# Patient Record
Sex: Male | Born: 1955 | Hispanic: No | Marital: Married | Smoking: Never smoker
Health system: Southern US, Community
[De-identification: ages and names within clinical notes are randomized; demographics above are authoritative.]

---

## 2007-07-23 ENCOUNTER — Ambulatory Visit: Payer: Self-pay | Admitting: Cardiovascular Disease

## 2017-10-22 ENCOUNTER — Other Ambulatory Visit: Payer: Self-pay | Admitting: Specialist

## 2017-10-22 DIAGNOSIS — S86912A Strain of unspecified muscle(s) and tendon(s) at lower leg level, left leg, initial encounter: Secondary | ICD-10-CM

## 2017-10-24 ENCOUNTER — Ambulatory Visit
Admission: RE | Admit: 2017-10-24 | Discharge: 2017-10-24 | Disposition: A | Discharge status: Home / Self Care | Payer: BLUE CROSS/BLUE SHIELD | Source: Ambulatory Visit | Attending: Specialist | Admitting: Specialist

## 2017-10-24 DIAGNOSIS — S86912A Strain of unspecified muscle(s) and tendon(s) at lower leg level, left leg, initial encounter: Secondary | ICD-10-CM

## 2017-10-27 ENCOUNTER — Other Ambulatory Visit: Payer: Self-pay

## 2019-07-24 IMAGING — MR MR KNEE*L* W/O CM
5 of 8 series · 27 of 40 positions shown · non-contrast
Comparison: None.

CLINICAL DATA: Knee pain with instability and popping. Patient
reports knee pops in and out when bending and extending knee.
History of left knee surgery 23 years ago. No recent injury.

EXAM:
MRI OF THE LEFT KNEE WITHOUT CONTRAST
TECHNIQUE: Multiplanar, multisequence MR imaging of the knee was performed. No
intravenous contrast was administered.

[Series 3: T2 fat-sat · axial · 3.5mm · 0.62mm/px · z∈[-79,+68]mm · 7 of 36 slices shown (1 of 3)]
[im 1/36]
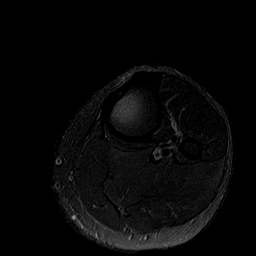
[im 6/36]
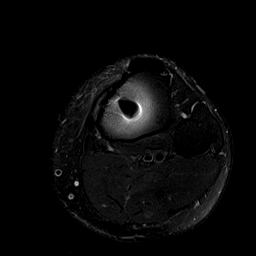
[im 12/36]
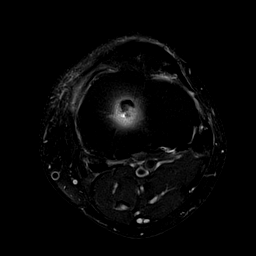
[im 18/36]
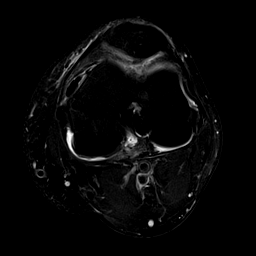
[im 24/36]
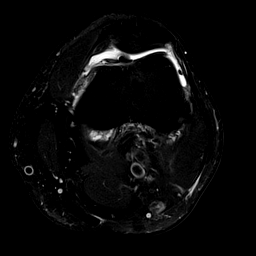
[im 30/36]
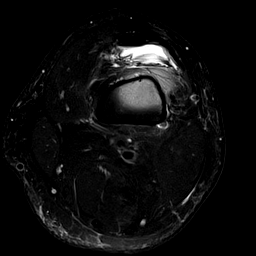
[im 36/36]
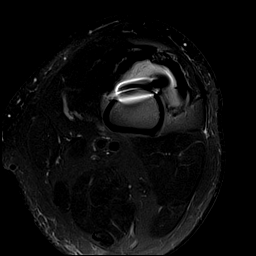

[Series 4: T1 · coronal · 3.5mm · 0.25mm/px · 5 of 30 slices shown]
[im 1/30]
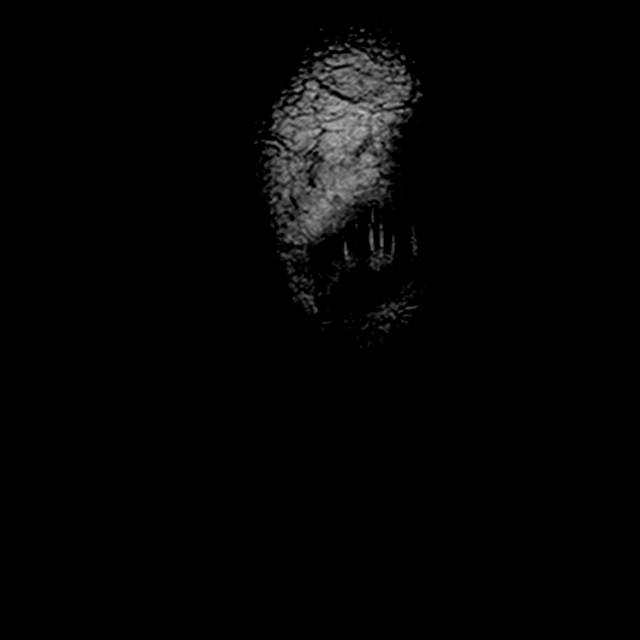
[im 8/30]
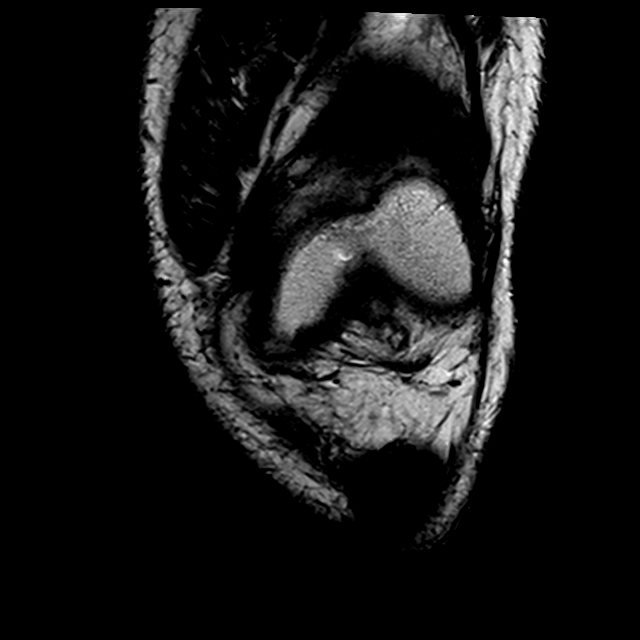
[im 15/30]
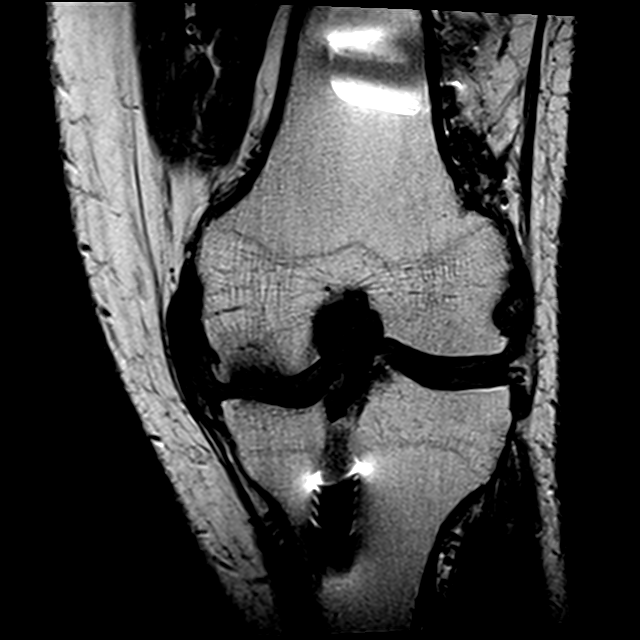
[im 22/30]
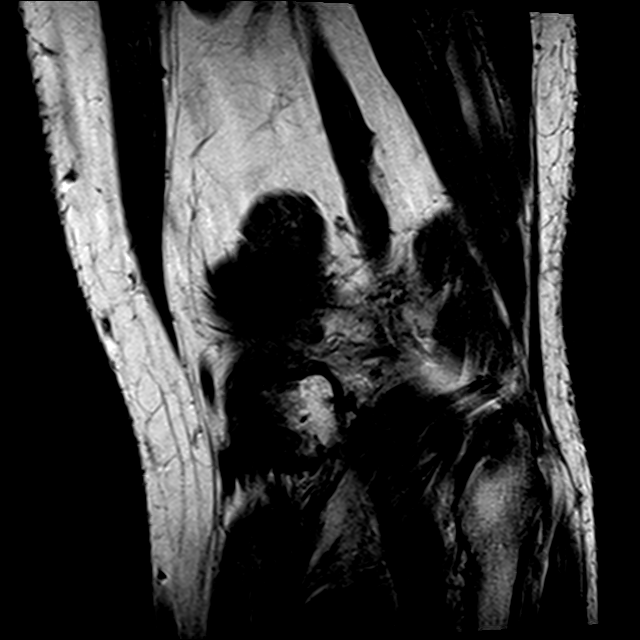
[im 30/30]
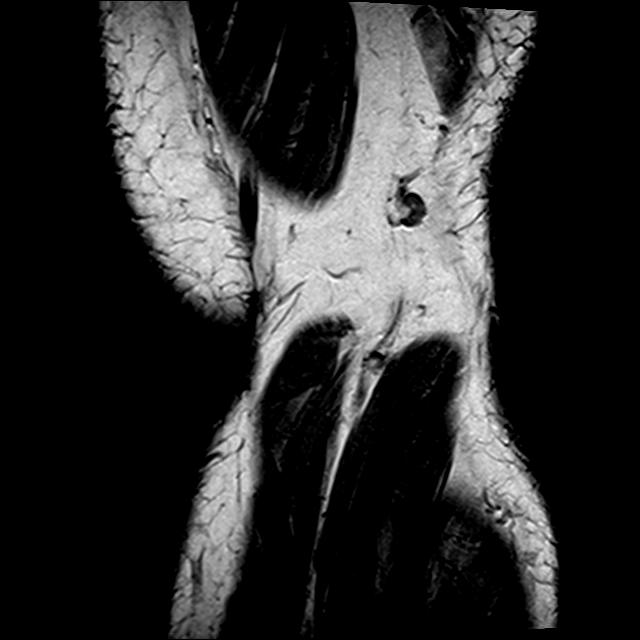

[Series 5: T2 fat-sat · coronal · 3.5mm · 0.62mm/px · 5 of 30 slices shown (2 of 3)]
[im 1/30]
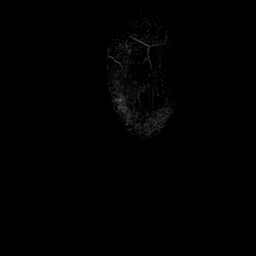
[im 8/30]
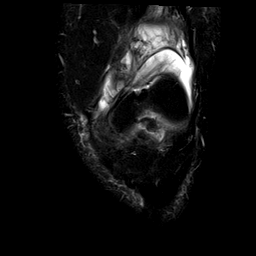
[im 15/30]
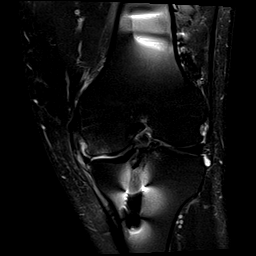
[im 22/30]
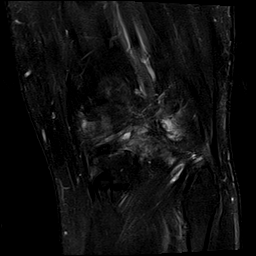
[im 30/30]
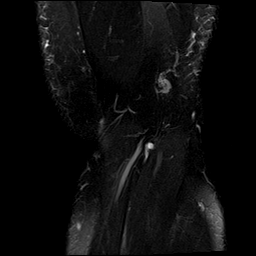

[Series 7: PD fat-sat · sagittal · 3.5mm · 0.31mm/px · 5 of 28 slices shown]
[im 1/28]
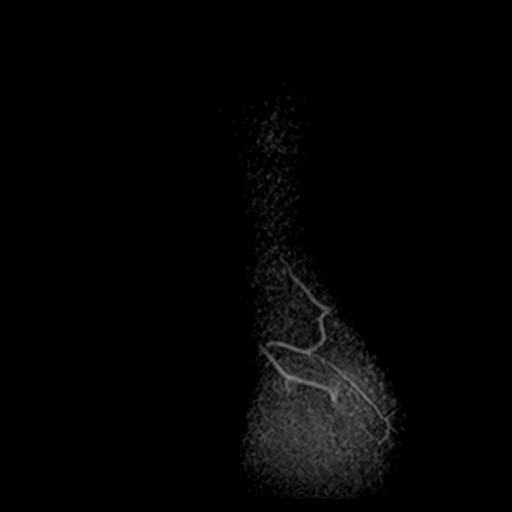
[im 7/28]
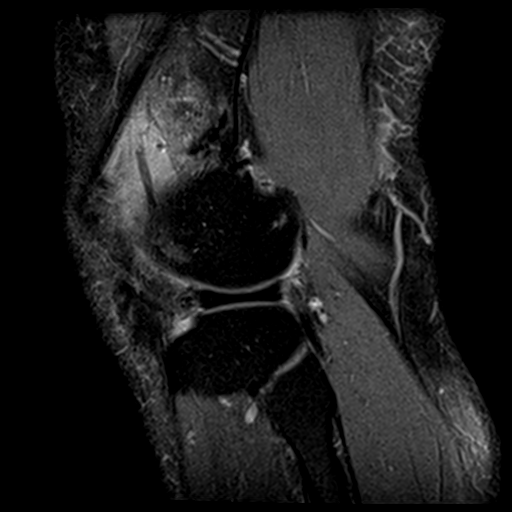
[im 14/28]
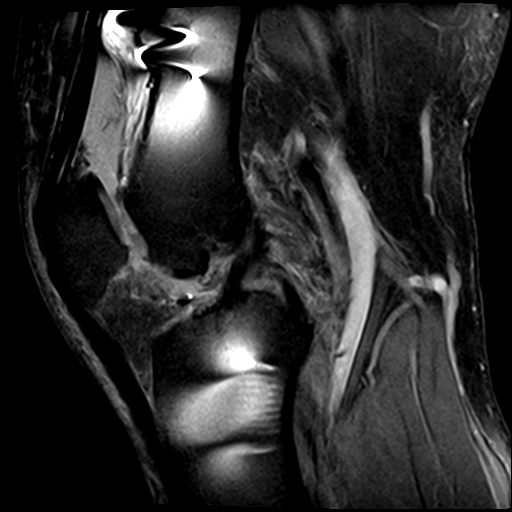
[im 21/28]
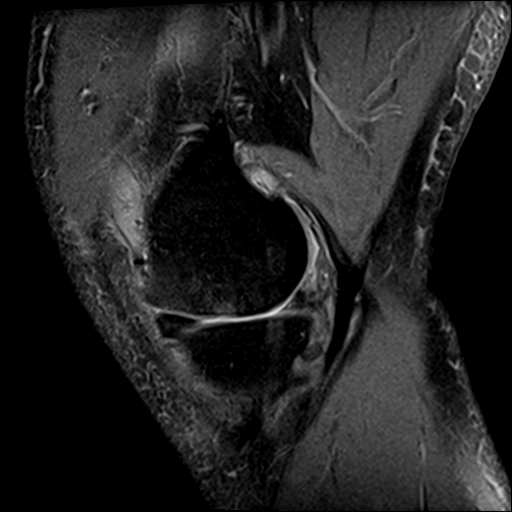
[im 28/28]
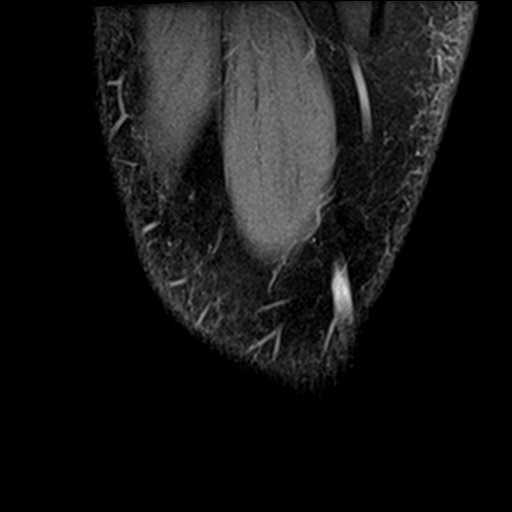

[Series 8: T2 fat-sat · sagittal · 3.5mm · 0.62mm/px · 5 of 28 slices shown (3 of 3)]
[im 1/28]
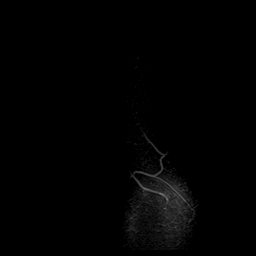
[im 7/28]
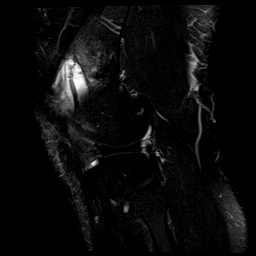
[im 14/28]
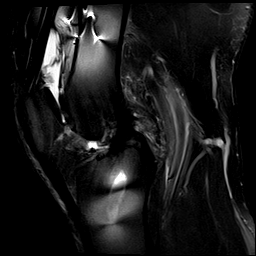
[im 21/28]
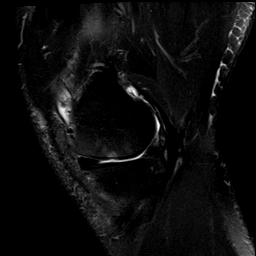
[im 28/28]
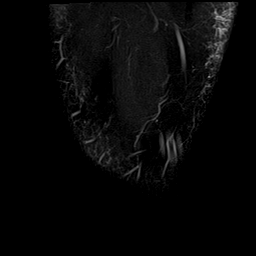

[27 of 40 positions shown; findings below may reference images not displayed]

FINDINGS: MENISCI

Medial meniscus: The medial meniscus is diminutive and partially
extruded peripherally from the joint. There is diffuse free edge
irregularity, especially in the body and posterior horn, including
the meniscal root. No centrally displaced meniscal fragment is
identified, although there is a possible large meniscal fragment in
the anterior meniscofemoral recess. This is most obvious on sagittal
image [DATE]. There is some postsurgical susceptibility artifact along
the superior aspect of this fragment which is larger than typical.

Lateral meniscus:  Discoid configuration without evidence of tear.

LIGAMENTS

Cruciates: Status post ACL reconstruction. The ACL graft is intact
the PCL is intact.

Collaterals: Intact. There is medial buckling and degeneration of
the medial collateral ligament.

CARTILAGE

Patellofemoral: Mild chondral thinning, surface irregularity and
osteophyte formation medially. No full-thickness defect.

Medial: Advanced chondral thinning with osteophytes and subchondral
edema in the medial femoral condyle.

Lateral:  Mild chondral thinning without focal defect.

MISCELLANEOUS

Joint: Moderate size joint effusion with synovial irregularity
consistent with synovitis. There are scattered foci of postsurgical
susceptibility artifact in the joint. As above, there is a possible
large meniscal fragment adjacent to the medial femoral condyle.

Popliteal Fossa:  Unremarkable. No significant Baker's cyst.

Extensor Mechanism: Intact. Probable postsurgical changes in the
patellar tendon.

Bones:  No acute or significant extra-articular osseous findings.

Other: Mild postsurgical scarring in Hoffa's fat.
IMPRESSION: 1. Intact ACL graft status post ACL reconstruction.
2. The medial meniscus is diffusely diminutive with free edge
irregularity, likely in part secondary to previous meniscal surgery.
However, there is suspicion of recurrent meniscal tear with a
possible large meniscal fragment in the meniscofemoral recess. This
is larger than typical, perhaps secondary to associated synovitis.
3. Intact, discoid lateral meniscus.
4. Moderately advanced medial compartment degenerative changes
without acute osseous findings.
5. Joint effusion with synovitis.

## 2020-06-27 ENCOUNTER — Ambulatory Visit (INDEPENDENT_AMBULATORY_CARE_PROVIDER_SITE_OTHER): Payer: 59 | Admitting: Podiatry

## 2020-06-27 ENCOUNTER — Encounter: Payer: Self-pay | Admitting: Podiatry

## 2020-06-27 ENCOUNTER — Other Ambulatory Visit: Payer: Self-pay

## 2020-06-27 ENCOUNTER — Ambulatory Visit (INDEPENDENT_AMBULATORY_CARE_PROVIDER_SITE_OTHER): Payer: 59

## 2020-06-27 DIAGNOSIS — I10 Essential (primary) hypertension: Secondary | ICD-10-CM | POA: Insufficient documentation

## 2020-06-27 DIAGNOSIS — M722 Plantar fascial fibromatosis: Secondary | ICD-10-CM

## 2020-06-27 DIAGNOSIS — M51369 Other intervertebral disc degeneration, lumbar region without mention of lumbar back pain or lower extremity pain: Secondary | ICD-10-CM | POA: Insufficient documentation

## 2020-06-27 DIAGNOSIS — G8929 Other chronic pain: Secondary | ICD-10-CM | POA: Diagnosis not present

## 2020-06-27 DIAGNOSIS — M79673 Pain in unspecified foot: Secondary | ICD-10-CM | POA: Diagnosis not present

## 2020-06-27 DIAGNOSIS — E782 Mixed hyperlipidemia: Secondary | ICD-10-CM | POA: Insufficient documentation

## 2020-06-27 DIAGNOSIS — M25675 Stiffness of left foot, not elsewhere classified: Secondary | ICD-10-CM | POA: Diagnosis not present

## 2020-06-27 DIAGNOSIS — M5136 Other intervertebral disc degeneration, lumbar region: Secondary | ICD-10-CM | POA: Insufficient documentation

## 2020-06-27 MED ORDER — MELOXICAM 15 MG PO TABS: 15.0000 mg | ORAL_TABLET | Freq: Every day | ORAL | 1 refills | Status: DC

## 2020-06-27 NOTE — Patient Instructions (Signed)
For instructions on how to put on your Night Splint, please visit [email or web address]   Plantar Fasciitis (Heel Spur Syndrome) with Rehab The plantar fascia is a fibrous, ligament-like, soft-tissue structure that spans the bottom of the foot. Plantar fasciitis is a condition that causes pain in the foot due to inflammation of the tissue. SYMPTOMS   Pain and tenderness on the underneath side of the foot.  Pain that worsens with standing or walking. CAUSES  Plantar fasciitis is caused by irritation and injury to the plantar fascia on the underneath side of the foot. Common mechanisms of injury include:  Direct trauma to bottom of the foot.  Damage to a small nerve that runs under the foot where the main fascia attaches to the heel bone.  Stress placed on the plantar fascia due to bone spurs. RISK INCREASES WITH:   Activities that place stress on the plantar fascia (running, jumping, pivoting, or cutting).  Poor strength and flexibility.  Improperly fitted shoes.  Tight calf muscles.  Flat feet.  Failure to warm-up properly before activity.  Obesity. PREVENTION  Warm up and stretch properly before activity.  Allow for adequate recovery between workouts.  Maintain physical fitness:  Strength, flexibility, and endurance.  Cardiovascular fitness.  Maintain a health body weight.  Avoid stress on the plantar fascia.  Wear properly fitted shoes, including arch supports for individuals who have flat feet.  PROGNOSIS  If treated properly, then the symptoms of plantar fasciitis usually resolve without surgery. However, occasionally surgery is necessary.  RELATED COMPLICATIONS   Recurrent symptoms that may result in a chronic condition.  Problems of the lower back that are caused by compensating for the injury, such as limping.  Pain or weakness of the foot during push-off following surgery.  Chronic inflammation, scarring, and partial or complete fascia tear,  occurring more often from repeated injections.  TREATMENT  Treatment initially involves the use of ice and medication to help reduce pain and inflammation. The use of strengthening and stretching exercises may help reduce pain with activity, especially stretches of the Achilles tendon. These exercises may be performed at home or with a therapist. Your caregiver may recommend that you use heel cups of arch supports to help reduce stress on the plantar fascia. Occasionally, corticosteroid injections are given to reduce inflammation. If symptoms persist for greater than 6 months despite non-surgical (conservative), then surgery may be recommended.   MEDICATION   If pain medication is necessary, then nonsteroidal anti-inflammatory medications, such as aspirin and ibuprofen, or other minor pain relievers, such as acetaminophen, are often recommended.  Do not take pain medication within 7 days before surgery.  Prescription pain relievers may be given if deemed necessary by your caregiver. Use only as directed and only as much as you need.  Corticosteroid injections may be given by your caregiver. These injections should be reserved for the most serious cases, because they may only be given a certain number of times.  HEAT AND COLD  Cold treatment (icing) relieves pain and reduces inflammation. Cold treatment should be applied for 10 to 15 minutes every 2 to 3 hours for inflammation and pain and immediately after any activity that aggravates your symptoms. Use ice packs or massage the area with a piece of ice (ice massage).  Heat treatment may be used prior to performing the stretching and strengthening activities prescribed by your caregiver, physical therapist, or athletic trainer. Use a heat pack or soak the injury in warm water.  SEEK IMMEDIATE MEDICAL CARE   IF:  Treatment seems to offer no benefit, or the condition worsens.  Any medications produce adverse side effects.  EXERCISES- RANGE OF  MOTION (ROM) AND STRETCHING EXERCISES - Plantar Fasciitis (Heel Spur Syndrome) These exercises may help you when beginning to rehabilitate your injury. Your symptoms may resolve with or without further involvement from your physician, physical therapist or athletic trainer. While completing these exercises, remember:   Restoring tissue flexibility helps normal motion to return to the joints. This allows healthier, less painful movement and activity.  An effective stretch should be held for at least 30 seconds.  A stretch should never be painful. You should only feel a gentle lengthening or release in the stretched tissue.  RANGE OF MOTION - Toe Extension, Flexion  Sit with your right / left leg crossed over your opposite knee.  Grasp your toes and gently pull them back toward the top of your foot. You should feel a stretch on the bottom of your toes and/or foot.  Hold this stretch for 10 seconds.  Now, gently pull your toes toward the bottom of your foot. You should feel a stretch on the top of your toes and or foot.  Hold this stretch for 10 seconds. Repeat  times. Complete this stretch 3 times per day.   RANGE OF MOTION - Ankle Dorsiflexion, Active Assisted  Remove shoes and sit on a chair that is preferably not on a carpeted surface.  Place right / left foot under knee. Extend your opposite leg for support.  Keeping your heel down, slide your right / left foot back toward the chair until you feel a stretch at your ankle or calf. If you do not feel a stretch, slide your bottom forward to the edge of the chair, while still keeping your heel down.  Hold this stretch for 10 seconds. Repeat 3 times. Complete this stretch 2 times per day.   STRETCH  Gastroc, Standing  Place hands on wall.  Extend right / left leg, keeping the front knee somewhat bent.  Slightly point your toes inward on your back foot.  Keeping your right / left heel on the floor and your knee straight, shift  your weight toward the wall, not allowing your back to arch.  You should feel a gentle stretch in the right / left calf. Hold this position for 10 seconds. Repeat 3 times. Complete this stretch 2 times per day.  STRETCH  Soleus, Standing  Place hands on wall.  Extend right / left leg, keeping the other knee somewhat bent.  Slightly point your toes inward on your back foot.  Keep your right / left heel on the floor, bend your back knee, and slightly shift your weight over the back leg so that you feel a gentle stretch deep in your back calf.  Hold this position for 10 seconds. Repeat 3 times. Complete this stretch 2 times per day.  STRETCH  Gastrocsoleus, Standing  Note: This exercise can place a lot of stress on your foot and ankle. Please complete this exercise only if specifically instructed by your caregiver.   Place the ball of your right / left foot on a step, keeping your other foot firmly on the same step.  Hold on to the wall or a rail for balance.  Slowly lift your other foot, allowing your body weight to press your heel down over the edge of the step.  You should feel a stretch in your right / left calf.  Hold this position   for 10 seconds.  Repeat this exercise with a slight bend in your right / left knee. Repeat 3 times. Complete this stretch 2 times per day.   STRENGTHENING EXERCISES - Plantar Fasciitis (Heel Spur Syndrome)  These exercises may help you when beginning to rehabilitate your injury. They may resolve your symptoms with or without further involvement from your physician, physical therapist or athletic trainer. While completing these exercises, remember:   Muscles can gain both the endurance and the strength needed for everyday activities through controlled exercises.  Complete these exercises as instructed by your physician, physical therapist or athletic trainer. Progress the resistance and repetitions only as guided.  STRENGTH - Towel Curls  Sit in  a chair positioned on a non-carpeted surface.  Place your foot on a towel, keeping your heel on the floor.  Pull the towel toward your heel by only curling your toes. Keep your heel on the floor. Repeat 3 times. Complete this exercise 2 times per day.  STRENGTH - Ankle Inversion  Secure one end of a rubber exercise band/tubing to a fixed object (table, pole). Loop the other end around your foot just before your toes.  Place your fists between your knees. This will focus your strengthening at your ankle.  Slowly, pull your big toe up and in, making sure the band/tubing is positioned to resist the entire motion.  Hold this position for 10 seconds.  Have your muscles resist the band/tubing as it slowly pulls your foot back to the starting position. Repeat 3 times. Complete this exercises 2 times per day.  Document Released: 04/29/2005 Document Revised: 07/22/2011 Document Reviewed: 08/11/2008 [employer] Patient Information 2014 [employer].  

## 2020-07-01 NOTE — Progress Notes (Signed)
Subjective:   Patient ID: Darrell Glenn, male   DOB: 65 y.o.   MRN: [id number]   HPI 65 year old male presents to the office today for concerns of bilateral heel pain.  He states that 2017 time from work he felt on several steps.  Since then he states he has been having discomfort to the heels.  He states the pain gets worse after sitting for some time is better with activity.  He states that the use of the latter makes the foot worse.  He also states that since the injury he had difficulty moving his left big toe.  Right heel is worse than left.  He said no recent treatment for the feet but he states that after the injury he had x-rays and he was told nothing was broken.  No other concerns today.   Review of Systems  All other systems reviewed and are negative.  History reviewed. No pertinent past medical history.  History reviewed. No pertinent surgical history.   Current Outpatient Medications:  .  meloxicam (MOBIC) 15 MG tablet, Take 1 tablet (15 mg total) by mouth daily., Disp: 30 tablet, Rfl: 1 .  Acetaminophen (TYLENOL) 325 MG CAPS, 1 capsule as needed, Disp: , Rfl:  .  aspirin 81 MG EC tablet, Take by mouth., Disp: , Rfl:  .  levothyroxine (SYNTHROID) 25 MCG tablet, Take 25 mcg by mouth daily., Disp: , Rfl:  .  olmesartan (BENICAR) 20 MG tablet, Take by mouth., Disp: , Rfl:  .  pantoprazole (PROTONIX) 40 MG tablet, Take 40 mg by mouth daily., Disp: , Rfl:  .  rosuvastatin (CRESTOR) 10 MG tablet, Take 10 mg by mouth daily., Disp: , Rfl:   Allergies  Allergen Reactions  . Codeine Hives        Objective:  Physical Exam  General: AAO x3, NAD  Dermatological: Skin is warm, dry and supple bilateral. There are no open sores, no preulcerative lesions, no rash or signs of infection present.  Vascular: Dorsalis Pedis artery and Posterior Tibial artery pedal pulses are 2/4 bilateral with immedate capillary fill time.There is no pain with calf compression, swelling, warmth, erythema.    Neruologic: Grossly intact via light touch bilateral.  Sensation intact with Semmes Weinstein monofilament.  Negative Tinel's sign.  Musculoskeletal: There is tenderness palpation on the plantar medial tubercle of the calcaneus at the insertion of plantar fascia on the right side worse than left.  There is no pain with lateral compression of calcaneus.  No pain Achilles tendon.  There is no edema, erythema.  There is decrease in the motion of the first MPJ of the left side.  MMT 5 out of 5 otherwise.  Gait: Unassisted, Nonantalgic.       Assessment:   Plantar fasciitis, decreased left first MPJ range of motion    Plan:  -Treatment options discussed including all alternatives, risks, and complications -Etiology of symptoms were discussed -X-rays were obtained and reviewed with the patient.  There is no definitive evidence of acute fracture or stress fracture.  Likely possible old scarring present calcaneal body but does not appear to be new. -Discussed steroid injection however he decided to hold off on this today. Prescribed mobic. Discussed side effects of the medication and directed to stop if any are to occur and call the office.  -Dispensed plantar fascial brace. -Discussed night splint -Discussed shoe modifications, orthotics as well as stretching exercises daily. -Regards the left hallux decrease tissue motion this is likely remote injury.  Could  have had a tendon tear.  Discussed MRI.  Vivi Barrack DPM

## 2020-08-20 ENCOUNTER — Other Ambulatory Visit: Payer: Self-pay | Admitting: Podiatry

## 2020-10-28 ENCOUNTER — Other Ambulatory Visit: Payer: Self-pay | Admitting: Podiatry

## 2022-07-30 ENCOUNTER — Other Ambulatory Visit: Payer: Self-pay | Admitting: Cardiovascular Disease

## 2022-09-21 ENCOUNTER — Other Ambulatory Visit: Payer: Self-pay | Admitting: Cardiovascular Disease

## 2022-12-04 ENCOUNTER — Telehealth: Payer: Self-pay | Admitting: Family Medicine

## 2022-12-05 MED ORDER — OLMESARTAN MEDOXOMIL 40 MG PO TABS
40.0000 mg | ORAL_TABLET | Freq: Every day | ORAL | 0 refills | Status: DC
Start: 2022-12-05 — End: 2024-03-23

## 2022-12-05 MED ORDER — METOPROLOL SUCCINATE ER 50 MG PO TB24: 50.0000 mg | ORAL_TABLET | Freq: Every day | ORAL | 0 refills | Status: DC

## 2022-12-06 NOTE — Telephone Encounter (Signed)
Error

## 2024-03-23 ENCOUNTER — Encounter: Payer: Self-pay | Admitting: Cardiovascular Disease

## 2024-03-23 ENCOUNTER — Ambulatory Visit (INDEPENDENT_AMBULATORY_CARE_PROVIDER_SITE_OTHER): Payer: Self-pay | Admitting: Cardiovascular Disease

## 2024-03-23 VITALS — BP 123/79 | HR 71 | Ht 66.0 in | Wt 210.6 lb

## 2024-03-23 DIAGNOSIS — E782 Mixed hyperlipidemia: Secondary | ICD-10-CM

## 2024-03-23 DIAGNOSIS — I10 Essential (primary) hypertension: Secondary | ICD-10-CM

## 2024-03-23 DIAGNOSIS — R079 Chest pain, unspecified: Secondary | ICD-10-CM | POA: Diagnosis not present

## 2024-03-23 DIAGNOSIS — R0789 Other chest pain: Secondary | ICD-10-CM

## 2024-03-23 MED ORDER — METOPROLOL SUCCINATE ER 50 MG PO TB24
50.0000 mg | ORAL_TABLET | Freq: Every day | ORAL | 0 refills | Status: AC
Start: 2024-03-23 — End: ?

## 2024-03-23 MED ORDER — OLMESARTAN MEDOXOMIL 40 MG PO TABS: 40.0000 mg | ORAL_TABLET | Freq: Every day | ORAL | 0 refills | Status: AC

## 2024-03-23 MED ORDER — PANTOPRAZOLE SODIUM 40 MG PO TBEC: 40.0000 mg | DELAYED_RELEASE_TABLET | Freq: Every day | ORAL | 2 refills | Status: AC

## 2024-03-23 MED ORDER — ASPIRIN 81 MG PO TBEC: 81.0000 mg | DELAYED_RELEASE_TABLET | Freq: Every day | ORAL | 0 refills | Status: AC

## 2024-03-23 NOTE — Progress Notes (Signed)
 Cardiology Office Note   Date:  03/23/2024   ID:  Colt Martelle, DOB 1956-05-02, MRN [id number]  PCP:  Frederik Charleston, MD  Cardiologist:  Denyse Bathe, MD      History of Present Illness: Darrell Glenn is a 68 y.o. male who presents for  Chief Complaint  Patient presents with   Follow-up    Cardiac follow up    Has occasional chest pain. Concerned as sister had MI and stents.  Chest Pain  This is a recurrent problem. The current episode started in the past 7 days. The onset quality is gradual. The problem occurs daily. The problem has been waxing and waning. The quality of the pain is described as dull.      History reviewed. No pertinent past medical history.   History reviewed. No pertinent surgical history.   Current Outpatient Medications  Medication Sig Dispense Refill   rosuvastatin (CRESTOR) 10 MG tablet Take 10 mg by mouth daily.     aspirin EC 81 MG tablet Take 1 tablet (81 mg total) by mouth daily. 90 tablet 0   metoprolol  succinate (TOPROL -XL) 50 MG 24 hr tablet Take 1 tablet (50 mg total) by mouth daily. 90 tablet 0   olmesartan  (BENICAR ) 40 MG tablet Take 1 tablet (40 mg total) by mouth daily. 90 tablet 0   pantoprazole (PROTONIX) 40 MG tablet Take 1 tablet (40 mg total) by mouth daily. 90 tablet 2   No current facility-administered medications for this visit.    Allergies:   Codeine    Social History:   has no history on file for tobacco use, alcohol use, and drug use.   Family History:  family history is not on file.    ROS:     Review of Systems  Constitutional: Negative.   HENT: Negative.    Eyes: Negative.   Respiratory: Negative.    Cardiovascular:  Positive for chest pain.  Gastrointestinal: Negative.   Genitourinary: Negative.   Musculoskeletal: Negative.   Skin: Negative.   Neurological: Negative.   Endo/Heme/Allergies: Negative.   Psychiatric/Behavioral: Negative.    All other systems reviewed and are negative.     All  other systems are reviewed and negative.    PHYSICAL EXAM: VS:  BP 123/79   Pulse 71   Ht 5' 6 (1.676 m)   Wt 210 lb 9.6 oz (95.5 kg)   SpO2 97%   BMI 33.99 kg/m  , BMI Body mass index is 33.99 kg/m. Last weight:  Wt Readings from Last 3 Encounters:  03/23/24 210 lb 9.6 oz (95.5 kg)     Physical Exam Vitals reviewed.  Constitutional:      Appearance: Normal appearance. He is normal weight.  HENT:     Head: Normocephalic.     Nose: Nose normal.     Mouth/Throat:     Mouth: Mucous membranes are moist.  Eyes:     Pupils: Pupils are equal, round, and reactive to light.  Cardiovascular:     Rate and Rhythm: Normal rate and regular rhythm.     Pulses: Normal pulses.     Heart sounds: Normal heart sounds.  Pulmonary:     Effort: Pulmonary effort is normal.  Abdominal:     General: Abdomen is flat. Bowel sounds are normal.  Musculoskeletal:        General: Normal range of motion.     Cervical back: Normal range of motion.  Skin:    General: Skin is warm.  Neurological:  General: No focal deficit present.     Mental Status: He is alert.  Psychiatric:        Mood and Affect: Mood normal.       EKG: NSR 71/min no acute changes  Recent Labs: No results found for requested labs within last 365 days.    Lipid Panel No results found for: CHOL, TRIG, HDL, CHOLHDL, VLDL, LDLCALC, LDLDIRECT    Other studies Reviewed: Additional studies/ records that were reviewed today include:  Review of the above records demonstrates:       No data to display            ASSESSMENT AND PLAN:    ICD-10-CM   1. Essential hypertension  I10 metoprolol  succinate (TOPROL -XL) 50 MG 24 hr tablet    aspirin EC 81 MG tablet    olmesartan  (BENICAR ) 40 MG tablet    pantoprazole (PROTONIX) 40 MG tablet    PCV ECHOCARDIOGRAM COMPLETE    MYOCARDIAL PERFUSION IMAGING    Comprehensive metabolic panel    CBC with Differential/Platelet    Lipid Profile    2. Mixed  hyperlipidemia  E78.2 metoprolol  succinate (TOPROL -XL) 50 MG 24 hr tablet    aspirin EC 81 MG tablet    olmesartan  (BENICAR ) 40 MG tablet    pantoprazole (PROTONIX) 40 MG tablet    PCV ECHOCARDIOGRAM COMPLETE    MYOCARDIAL PERFUSION IMAGING    Comprehensive metabolic panel    CBC with Differential/Platelet    Lipid Profile    3. Other chest pain  R07.89 metoprolol  succinate (TOPROL -XL) 50 MG 24 hr tablet    aspirin EC 81 MG tablet    olmesartan  (BENICAR ) 40 MG tablet    pantoprazole (PROTONIX) 40 MG tablet    PCV ECHOCARDIOGRAM COMPLETE    MYOCARDIAL PERFUSION IMAGING    Comprehensive metabolic panel    CBC with Differential/Platelet    Lipid Profile       Problem List Items Addressed This Visit       Cardiovascular and Mediastinum   Essential hypertension - Primary   Relevant Medications   metoprolol  succinate (TOPROL -XL) 50 MG 24 hr tablet   aspirin EC 81 MG tablet   olmesartan  (BENICAR ) 40 MG tablet   pantoprazole (PROTONIX) 40 MG tablet   Other Relevant Orders   PCV ECHOCARDIOGRAM COMPLETE   MYOCARDIAL PERFUSION IMAGING   Comprehensive metabolic panel   CBC with Differential/Platelet   Lipid Profile     Other   Mixed hyperlipidemia   Relevant Medications   metoprolol  succinate (TOPROL -XL) 50 MG 24 hr tablet   aspirin EC 81 MG tablet   olmesartan  (BENICAR ) 40 MG tablet   pantoprazole (PROTONIX) 40 MG tablet   Other Relevant Orders   PCV ECHOCARDIOGRAM COMPLETE   MYOCARDIAL PERFUSION IMAGING   Comprehensive metabolic panel   CBC with Differential/Platelet   Lipid Profile   Other Visit Diagnoses       Other chest pain       Relevant Medications   metoprolol  succinate (TOPROL -XL) 50 MG 24 hr tablet   aspirin EC 81 MG tablet   olmesartan  (BENICAR ) 40 MG tablet   pantoprazole (PROTONIX) 40 MG tablet   Other Relevant Orders   PCV ECHOCARDIOGRAM COMPLETE   MYOCARDIAL PERFUSION IMAGING   Comprehensive metabolic panel   CBC with Differential/Platelet    Lipid Profile          Disposition:   Return in about 6 weeks (around 05/04/2024) for echo, stress test and f/u.  Total time spent: 30 minutes  Signed,  Denyse Bathe, MD  03/23/2024 9:55 AM    [facility]

## 2024-03-24 LAB — CBC WITH DIFFERENTIAL/PLATELET
Basophils Absolute: 0 x10E3/uL (ref 0.0–0.2)
Basos: 1 %
EOS (ABSOLUTE): 0.2 x10E3/uL (ref 0.0–0.4)
Eos: 3 %
Hematocrit: 44.9 % (ref 37.5–51.0)
Hemoglobin: 15.1 g/dL (ref 13.0–17.7)
Immature Grans (Abs): 0 x10E3/uL (ref 0.0–0.1)
Immature Granulocytes: 0 %
Lymphocytes Absolute: 1.9 x10E3/uL (ref 0.7–3.1)
Lymphs: 34 %
MCH: 30.1 pg (ref 26.6–33.0)
MCHC: 33.6 g/dL (ref 31.5–35.7)
MCV: 90 fL (ref 79–97)
Monocytes Absolute: 0.6 x10E3/uL (ref 0.1–0.9)
Monocytes: 10 %
Neutrophils Absolute: 2.9 x10E3/uL (ref 1.4–7.0)
Neutrophils: 52 %
Platelets: 173 x10E3/uL (ref 150–450)
RBC: 5.01 x10E6/uL (ref 4.14–5.80)
RDW: 13.7 % (ref 11.6–15.4)
WBC: 5.6 x10E3/uL (ref 3.4–10.8)

## 2024-03-24 LAB — COMPREHENSIVE METABOLIC PANEL WITH GFR
ALT: 19 IU/L (ref 0–44)
AST: 18 IU/L (ref 0–40)
Albumin: 4.4 g/dL (ref 3.9–4.9)
Alkaline Phosphatase: 93 IU/L (ref 47–123)
BUN/Creatinine Ratio: 24 (ref 10–24)
BUN: 21 mg/dL (ref 8–27)
Bilirubin Total: 0.5 mg/dL (ref 0.0–1.2)
CO2: 21 mmol/L (ref 20–29)
Calcium: 8.8 mg/dL (ref 8.6–10.2)
Chloride: 104 mmol/L (ref 96–106)
Creatinine, Ser: 0.86 mg/dL (ref 0.76–1.27)
Globulin, Total: 2.4 g/dL (ref 1.5–4.5)
Glucose: 107 mg/dL — ABNORMAL HIGH (ref 70–99)
Potassium: 4 mmol/L (ref 3.5–5.2)
Sodium: 140 mmol/L (ref 134–144)
Total Protein: 6.8 g/dL (ref 6.0–8.5)
eGFR: 95 mL/min/1.73 (ref >59)

## 2024-03-24 LAB — LIPID PANEL
Chol/HDL Ratio: 3.4 ratio (ref 0.0–5.0)
Cholesterol, Total: 171 mg/dL (ref 100–199)
HDL: 50 mg/dL (ref >39)
LDL Chol Calc (NIH): 108 mg/dL — ABNORMAL HIGH (ref 0–99)
Triglycerides: 69 mg/dL (ref 0–149)
VLDL Cholesterol Cal: 13 mg/dL (ref 5–40)

## 2024-04-19 ENCOUNTER — Ambulatory Visit

## 2024-04-19 DIAGNOSIS — R0789 Other chest pain: Secondary | ICD-10-CM

## 2024-04-19 DIAGNOSIS — I10 Essential (primary) hypertension: Secondary | ICD-10-CM

## 2024-04-19 DIAGNOSIS — E782 Mixed hyperlipidemia: Secondary | ICD-10-CM

## 2024-04-22 ENCOUNTER — Ambulatory Visit

## 2024-04-22 DIAGNOSIS — R0789 Other chest pain: Secondary | ICD-10-CM | POA: Diagnosis not present

## 2024-04-22 DIAGNOSIS — E782 Mixed hyperlipidemia: Secondary | ICD-10-CM

## 2024-04-22 DIAGNOSIS — I10 Essential (primary) hypertension: Secondary | ICD-10-CM

## 2024-04-27 ENCOUNTER — Ambulatory Visit: Admitting: Cardiovascular Disease

## 2024-04-30 ENCOUNTER — Encounter: Payer: Self-pay | Admitting: Cardiovascular Disease

## 2024-04-30 ENCOUNTER — Ambulatory Visit: Admitting: Cardiovascular Disease

## 2024-04-30 VITALS — BP 119/79 | HR 87 | Ht 66.0 in | Wt 215.0 lb

## 2024-04-30 DIAGNOSIS — K219 Gastro-esophageal reflux disease without esophagitis: Secondary | ICD-10-CM

## 2024-04-30 DIAGNOSIS — I10 Essential (primary) hypertension: Secondary | ICD-10-CM

## 2024-04-30 DIAGNOSIS — R0789 Other chest pain: Secondary | ICD-10-CM

## 2024-04-30 DIAGNOSIS — I340 Nonrheumatic mitral (valve) insufficiency: Secondary | ICD-10-CM | POA: Diagnosis not present

## 2024-04-30 DIAGNOSIS — E782 Mixed hyperlipidemia: Secondary | ICD-10-CM | POA: Diagnosis not present

## 2024-04-30 NOTE — Progress Notes (Signed)
 "     Cardiology Office Note   Date:  04/30/2024   ID:  Darrell Glenn, DOB 1956/02/04, MRN [id number]  PCP:  Frederik Charleston, MD  Cardiologist:  Denyse Bathe, MD      History of Present Illness: Darrell Glenn is a 68 y.o. male who presents for  Chief Complaint  Patient presents with   Follow-up    Echo & NST results    Has occasional chest pains.      History reviewed. No pertinent past medical history.   History reviewed. No pertinent surgical history.   Current Outpatient Medications  Medication Sig Dispense Refill   aspirin  EC 81 MG tablet Take 1 tablet (81 mg total) by mouth daily. 90 tablet 0   metoprolol  succinate (TOPROL -XL) 50 MG 24 hr tablet Take 1 tablet (50 mg total) by mouth daily. 90 tablet 0   olmesartan  (BENICAR ) 40 MG tablet Take 1 tablet (40 mg total) by mouth daily. 90 tablet 0   pantoprazole  (PROTONIX ) 40 MG tablet Take 1 tablet (40 mg total) by mouth daily. (Patient taking differently: Take 40 mg by mouth as needed.) 90 tablet 2   rosuvastatin (CRESTOR) 10 MG tablet Take 10 mg by mouth daily. (Patient taking differently: Take 10 mg by mouth as needed.)     No current facility-administered medications for this visit.    Allergies:   Codeine    Social History:   reports that he has never smoked. He has never used smokeless tobacco. He reports that he does not drink alcohol. No history on file for drug use.   Family History:  family history is not on file.    ROS:     Review of Systems  Constitutional: Negative.   HENT: Negative.    Eyes: Negative.   Respiratory: Negative.    Gastrointestinal: Negative.   Genitourinary: Negative.   Musculoskeletal: Negative.   Skin: Negative.   Neurological: Negative.   Endo/Heme/Allergies: Negative.   Psychiatric/Behavioral: Negative.    All other systems reviewed and are negative.     All other systems are reviewed and negative.    PHYSICAL EXAM: VS:  BP 119/79   Pulse 87   Ht 5' 6 (1.676  m)   Wt 215 lb (97.5 kg)   SpO2 97%   BMI 34.70 kg/m  , BMI Body mass index is 34.7 kg/m. Last weight:  Wt Readings from Last 3 Encounters:  04/30/24 215 lb (97.5 kg)  03/23/24 210 lb 9.6 oz (95.5 kg)     Physical Exam Vitals reviewed.  Constitutional:      Appearance: Normal appearance. He is normal weight.  HENT:     Head: Normocephalic.     Nose: Nose normal.     Mouth/Throat:     Mouth: Mucous membranes are moist.  Eyes:     Pupils: Pupils are equal, round, and reactive to light.  Cardiovascular:     Rate and Rhythm: Normal rate and regular rhythm.     Pulses: Normal pulses.     Heart sounds: Normal heart sounds.  Pulmonary:     Effort: Pulmonary effort is normal.  Abdominal:     General: Abdomen is flat. Bowel sounds are normal.  Musculoskeletal:        General: Normal range of motion.     Cervical back: Normal range of motion.  Skin:    General: Skin is warm.  Neurological:     General: No focal deficit present.     Mental Status:  He is alert.  Psychiatric:        Mood and Affect: Mood normal.       EKG:   Recent Labs: 03/23/2024: ALT 19; BUN 21; Creatinine, Ser 0.86; Hemoglobin 15.1; Platelets 173; Potassium 4.0; Sodium 140    Lipid Panel    Component Value Date/Time   CHOL 171 03/23/2024 0959   TRIG 69 03/23/2024 0959   HDL 50 03/23/2024 0959   CHOLHDL 3.4 03/23/2024 0959   LDLCALC 108 (H) 03/23/2024 0959      Other studies Reviewed: Additional studies/ records that were reviewed today include:  Review of the above records demonstrates:       No data to display            ASSESSMENT AND PLAN:    ICD-10-CM   1. Nonrheumatic mitral valve regurgitation  I34.0    Has Mild MR, trace TR.    2. Essential hypertension  I10    Controlled BP but has LVH and diastolic dysfunction but no SOB, add farxiga if SOB.    3. Mixed hyperlipidemia  E78.2     4. Other chest pain  R07.89    stress test was normal.    5. Gastroesophageal  reflux disease without esophagitis  K21.9    Atypical chest pains. Advise taking protonix  40 daily.       Problem List Items Addressed This Visit       Cardiovascular and Mediastinum   Essential hypertension     Other   Mixed hyperlipidemia   Other Visit Diagnoses       Nonrheumatic mitral valve regurgitation    -  Primary   Has Mild MR, trace TR.     Other chest pain       stress test was normal.     Gastroesophageal reflux disease without esophagitis       Atypical chest pains. Advise taking protonix  40 daily.          Disposition:   Return in about 3 months (around 07/29/2024).    Total time spent: 40 minutes  Signed,  Denyse Bathe, MD  04/30/2024 10:25 AM    [facility] "

## 2024-05-07 ENCOUNTER — Ambulatory Visit: Admitting: Cardiovascular Disease

## 2024-05-10 ENCOUNTER — Ambulatory Visit: Admitting: Cardiovascular Disease

## 2024-05-27 ENCOUNTER — Other Ambulatory Visit (HOSPITAL_COMMUNITY): Payer: Self-pay

## 2024-05-27 DIAGNOSIS — S6992XA Unspecified injury of left wrist, hand and finger(s), initial encounter: Secondary | ICD-10-CM

## 2024-05-27 DIAGNOSIS — M7989 Other specified soft tissue disorders: Secondary | ICD-10-CM

## 2024-05-27 DIAGNOSIS — M25532 Pain in left wrist: Secondary | ICD-10-CM

## 2024-05-27 DIAGNOSIS — M25632 Stiffness of left wrist, not elsewhere classified: Secondary | ICD-10-CM

## 2024-05-29 ENCOUNTER — Ambulatory Visit (HOSPITAL_COMMUNITY): Admission: RE | Admit: 2024-05-29 | Discharge: 2024-05-29 | Disposition: A | Source: Ambulatory Visit

## 2024-05-29 DIAGNOSIS — S6992XA Unspecified injury of left wrist, hand and finger(s), initial encounter: Secondary | ICD-10-CM | POA: Insufficient documentation

## 2024-05-29 DIAGNOSIS — M25532 Pain in left wrist: Secondary | ICD-10-CM | POA: Insufficient documentation

## 2024-05-29 DIAGNOSIS — M7989 Other specified soft tissue disorders: Secondary | ICD-10-CM | POA: Diagnosis present

## 2024-05-29 DIAGNOSIS — M25632 Stiffness of left wrist, not elsewhere classified: Secondary | ICD-10-CM | POA: Insufficient documentation
